# Patient Record
Sex: Female | Born: 1945 | Race: White | Hispanic: No | Marital: Married | Smoking: Never smoker
Health system: Southern US, Community
[De-identification: ages and names within clinical notes are randomized; demographics above are authoritative.]

## PROBLEM LIST (undated history)

## (undated) DIAGNOSIS — E663 Overweight: Secondary | ICD-10-CM

## (undated) DIAGNOSIS — K5900 Constipation, unspecified: Secondary | ICD-10-CM

## (undated) DIAGNOSIS — E039 Hypothyroidism, unspecified: Secondary | ICD-10-CM

## (undated) DIAGNOSIS — N6019 Diffuse cystic mastopathy of unspecified breast: Secondary | ICD-10-CM

## (undated) DIAGNOSIS — T7840XA Allergy, unspecified, initial encounter: Secondary | ICD-10-CM

## (undated) DIAGNOSIS — M810 Age-related osteoporosis without current pathological fracture: Secondary | ICD-10-CM

## (undated) DIAGNOSIS — K635 Polyp of colon: Secondary | ICD-10-CM

## (undated) HISTORY — DX: Hypothyroidism, unspecified: E03.9

## (undated) HISTORY — DX: Diffuse cystic mastopathy of unspecified breast: N60.19

## (undated) HISTORY — DX: Overweight: E66.3

## (undated) HISTORY — DX: Polyp of colon: K63.5

## (undated) HISTORY — PX: THYROIDECTOMY: SHX17

## (undated) HISTORY — DX: Age-related osteoporosis without current pathological fracture: M81.0

## (undated) HISTORY — DX: Constipation, unspecified: K59.00

## (undated) HISTORY — PX: BREAST BIOPSY: SHX20

## (undated) HISTORY — PX: BREAST EXCISIONAL BIOPSY: SUR124

## (undated) HISTORY — DX: Allergy, unspecified, initial encounter: T78.40XA

---

## 1999-07-12 ENCOUNTER — Encounter: Payer: Self-pay | Admitting: *Deleted

## 1999-07-12 ENCOUNTER — Encounter: Admission: RE | Admit: 1999-07-12 | Discharge: 1999-07-12 | Payer: Self-pay | Admitting: *Deleted

## 2000-03-19 ENCOUNTER — Encounter: Admission: RE | Admit: 2000-03-19 | Discharge: 2000-03-19 | Payer: Self-pay | Admitting: General Surgery

## 2000-03-19 ENCOUNTER — Encounter: Payer: Self-pay | Admitting: General Surgery

## 2000-08-26 ENCOUNTER — Other Ambulatory Visit: Admission: RE | Admit: 2000-08-26 | Discharge: 2000-08-26 | Payer: Self-pay | Admitting: *Deleted

## 2000-10-10 ENCOUNTER — Encounter (INDEPENDENT_AMBULATORY_CARE_PROVIDER_SITE_OTHER): Payer: Self-pay | Admitting: *Deleted

## 2000-10-10 ENCOUNTER — Ambulatory Visit (HOSPITAL_COMMUNITY): Admission: RE | Admit: 2000-10-10 | Discharge: 2000-10-10 | Payer: Self-pay | Admitting: Gastroenterology

## 2001-05-29 ENCOUNTER — Encounter: Admission: RE | Admit: 2001-05-29 | Discharge: 2001-05-29 | Payer: Self-pay | Admitting: *Deleted

## 2001-05-29 ENCOUNTER — Encounter: Payer: Self-pay | Admitting: *Deleted

## 2001-09-03 ENCOUNTER — Other Ambulatory Visit: Admission: RE | Admit: 2001-09-03 | Discharge: 2001-09-03 | Payer: Self-pay | Admitting: *Deleted

## 2002-06-19 ENCOUNTER — Encounter: Admission: RE | Admit: 2002-06-19 | Discharge: 2002-06-19 | Payer: Self-pay

## 2002-07-01 ENCOUNTER — Encounter: Payer: Self-pay | Admitting: Obstetrics and Gynecology

## 2002-07-01 ENCOUNTER — Encounter: Admission: RE | Admit: 2002-07-01 | Discharge: 2002-07-01 | Payer: Self-pay | Admitting: Obstetrics and Gynecology

## 2002-11-19 ENCOUNTER — Other Ambulatory Visit: Admission: RE | Admit: 2002-11-19 | Discharge: 2002-11-19 | Payer: Self-pay | Admitting: Obstetrics & Gynecology

## 2003-02-18 ENCOUNTER — Encounter: Admission: RE | Admit: 2003-02-18 | Discharge: 2003-02-18 | Payer: Self-pay

## 2003-08-11 ENCOUNTER — Encounter: Admission: RE | Admit: 2003-08-11 | Discharge: 2003-08-11 | Payer: Self-pay | Admitting: Obstetrics and Gynecology

## 2004-08-21 ENCOUNTER — Encounter: Admission: RE | Admit: 2004-08-21 | Discharge: 2004-08-21 | Payer: Self-pay | Admitting: Internal Medicine

## 2005-09-07 ENCOUNTER — Encounter: Admission: RE | Admit: 2005-09-07 | Discharge: 2005-09-07 | Payer: Self-pay | Admitting: Obstetrics and Gynecology

## 2006-09-09 ENCOUNTER — Encounter: Admission: RE | Admit: 2006-09-09 | Discharge: 2006-09-09 | Payer: Self-pay | Admitting: Family Medicine

## 2007-09-24 ENCOUNTER — Encounter: Admission: RE | Admit: 2007-09-24 | Discharge: 2007-09-24 | Payer: Self-pay | Admitting: Family Medicine

## 2007-09-30 ENCOUNTER — Encounter: Admission: RE | Admit: 2007-09-30 | Discharge: 2007-09-30 | Payer: Self-pay | Admitting: Family Medicine

## 2008-03-24 ENCOUNTER — Encounter: Admission: RE | Admit: 2008-03-24 | Discharge: 2008-03-24 | Payer: Self-pay | Admitting: Family Medicine

## 2008-09-27 ENCOUNTER — Encounter: Admission: RE | Admit: 2008-09-27 | Discharge: 2008-09-27 | Payer: Self-pay | Admitting: Obstetrics and Gynecology

## 2009-11-28 ENCOUNTER — Encounter: Admission: RE | Admit: 2009-11-28 | Discharge: 2009-11-28 | Payer: Self-pay | Admitting: Family Medicine

## 2010-06-04 ENCOUNTER — Encounter: Payer: Self-pay | Admitting: Family Medicine

## 2010-06-30 ENCOUNTER — Other Ambulatory Visit: Payer: Self-pay | Admitting: Obstetrics and Gynecology

## 2010-06-30 DIAGNOSIS — M8580 Other specified disorders of bone density and structure, unspecified site: Secondary | ICD-10-CM

## 2010-06-30 DIAGNOSIS — Z780 Asymptomatic menopausal state: Secondary | ICD-10-CM

## 2010-07-14 ENCOUNTER — Ambulatory Visit
Admission: RE | Admit: 2010-07-14 | Discharge: 2010-07-14 | Disposition: A | Discharge status: Home / Self Care | Payer: BC Managed Care – PPO | Source: Ambulatory Visit | Attending: Obstetrics and Gynecology | Admitting: Obstetrics and Gynecology

## 2010-07-14 DIAGNOSIS — M8580 Other specified disorders of bone density and structure, unspecified site: Secondary | ICD-10-CM

## 2010-09-29 NOTE — Procedures (Signed)
[place]. [facility]  Patient:    Ashlee Rollins, Ashlee Rollins                         MRN: [id number] Proc. Date: 10/10/00 Attending:  Anselmo Rod, M.D. CC:         Heather Roberts, M.D.   Procedure Report  DATE OF BIRTH:  March 21, 1946.  PROCEDURE:  Colonoscopy with snare polypectomy x 1.  ENDOSCOPIST:  Anselmo Rod, M.D.  INSTRUMENT USED:  Olympus video colonoscope.  INDICATION FOR PROCEDURE:  A 65 year old white female with a history of rectal bleeding and chronic constipation.  Rule out colonic polyps, masses, hemorrhoids, etc.  PREPROCEDURE PREPARATION:  Informed consent was procured from the patient. The patient was fasted for eight hours prior to the procedure and prepped with a bottle of magnesium citrate and a gallon of NuLytely the night prior to the procedure.  PREPROCEDURE PHYSICAL:  VITAL SIGNS:  The patient had stable vital signs.  NECK:  Supple.  CHEST:  Clear to auscultation.  S1, S2 regular.  ABDOMEN:  Soft with normal bowel sounds.  DESCRIPTION OF PROCEDURE:  The patient was placed in the left lateral decubitus position and sedated with 50 mg of Demerol and 7.5 mg of Versed intravenously.  Once the patient was adequately sedate and maintained on low-flow oxygen and continuous cardiac monitoring, the Olympus video colonoscope was advanced from the rectum to the cecum without difficulty.  The patient had an excellent prep.  The entire colonic mucosa appeared healthy except for a small, flat polyp snared at 50 cm.  There were small internal hemorrhoids appreciated on retroflexion.  No other abnormalities were noted. The procedure was complete up to the cecum.  The ileocecal valve and the appendiceal orifice were clearly visualized and photographed.  IMPRESSION: 1. Healthy-appearing colon except for a small flat polyp snared at 50 cm. 2. Small, nonbleeding internal hemorrhoids.  RECOMMENDATIONS:  1. Await pathology results. 2.  High-fiber diet. 3. Avoid nonsteroidals for now. 3. Outpatient follow-up in the next four weeks. DD:  10/10/00 TD:  10/10/00 Job: [id number]

## 2010-12-19 ENCOUNTER — Other Ambulatory Visit: Payer: Self-pay | Admitting: Family Medicine

## 2010-12-19 DIAGNOSIS — Z1231 Encounter for screening mammogram for malignant neoplasm of breast: Secondary | ICD-10-CM

## 2011-01-02 ENCOUNTER — Ambulatory Visit
Admission: RE | Admit: 2011-01-02 | Discharge: 2011-01-02 | Disposition: A | Discharge status: Home / Self Care | Payer: BC Managed Care – PPO | Source: Ambulatory Visit | Attending: Family Medicine | Admitting: Family Medicine

## 2011-01-02 DIAGNOSIS — Z1231 Encounter for screening mammogram for malignant neoplasm of breast: Secondary | ICD-10-CM

## 2012-02-12 ENCOUNTER — Other Ambulatory Visit: Payer: Self-pay | Admitting: Family Medicine

## 2012-02-12 DIAGNOSIS — Z1231 Encounter for screening mammogram for malignant neoplasm of breast: Secondary | ICD-10-CM

## 2012-03-04 ENCOUNTER — Ambulatory Visit: Payer: BC Managed Care – PPO

## 2012-03-07 ENCOUNTER — Ambulatory Visit
Admission: RE | Admit: 2012-03-07 | Discharge: 2012-03-07 | Disposition: A | Discharge status: Home / Self Care | Payer: BC Managed Care – PPO | Source: Ambulatory Visit | Attending: Family Medicine | Admitting: Family Medicine

## 2012-03-07 DIAGNOSIS — Z1231 Encounter for screening mammogram for malignant neoplasm of breast: Secondary | ICD-10-CM

## 2012-04-23 ENCOUNTER — Other Ambulatory Visit: Payer: Self-pay | Admitting: Dermatology

## 2012-09-23 ENCOUNTER — Ambulatory Visit (INDEPENDENT_AMBULATORY_CARE_PROVIDER_SITE_OTHER): Payer: BC Managed Care – PPO | Admitting: Family Medicine

## 2012-09-23 VITALS — BP 118/82 | HR 105 | Ht 67.0 in | Wt 166.0 lb

## 2012-09-23 DIAGNOSIS — E663 Overweight: Secondary | ICD-10-CM

## 2012-09-23 DIAGNOSIS — Z6825 Body mass index (BMI) 25.0-25.9, adult: Secondary | ICD-10-CM

## 2012-09-23 NOTE — Patient Instructions (Addendum)
1)  Weight Loss - Bring back the HCG supplies and Ashlee Rollins will mix it up for you.    Diet Following Bariatric Surgery The bariatric diet is designed to provide fluids and nourishment while promoting weight loss after bariatric surgery. The diet is divided into 3 stages. The rate of progression varies based on individual food tolerance. DIET FOLLOWING BARIATRIC SURGERY The diet following surgery is divided into 3 stages to allow a gradual adjustment. It is very important to the success of your surgery to:  Progress to each stage slowly.  Eat at set times.  Chew food well and stop eating when you are full.  Not drink liquids 30 minutes before and after meals. If you feel tightness or pressure in your chest, that means you are full. Wait 30 minutes before you try to eat again. STAGE 1 BARIATRIC DIET - ABOUT 2 WEEKS IN DURATION   The diet begins the day of surgery. It will last about 1 to 2 weeks after surgery. Your surgeon may have individual guidelines for you about specific foods or the progression of your diet. Follow your surgeon's guidelines.  If clear liquids are well-tolerated without vomiting, your caregiver will add a 4 oz to 6 oz high protein, low-calorie liquid supplement. You could add this to your meal plan 3 times daily. You will need at least 60 g to 80 g of protein daily or as determined by your Registered Dietitian.  Guidelines for choosing a protein supplement include:  At least 15 g of protein per 8 oz serving.  Less than 20 g total carbohydrate per 8 oz serving.  Less than 5 g fat per 8 oz serving.  Avoid carbonated beverages, caffeine, alcohol, and concentrated sweets such as sugar, cakes, and cookies.  Right after surgery, you may only be able to eat 3 to 4 tsp per meal. Your maximum volume should not exceed  to  cup total. Do not eat or drink more than 1 oz or 2 tbs every 15 minutes.  Take a chewable multivitamin and mineral supplement.  Drink at least 48  oz of fluid daily, which includes your protein supplement. Food and beverages from the list below are allowed at set times (for example at 8 AM, 12 noon, or 5 PM):  Decaffeinated coffee or tea.  100% fruit juice.  Diet or sugar-free drinks.  Broth.  Blenderized soup.  Skim milk or lactose-free milk.  Sugar-free gelatin dessert or frozen ice pops.  Mashed potatoes.  Yogurt (artificially sweetened).  Sugar-free pudding.  Blended low-fat cottage cheese.  Unsweetened applesauce, grits, or hot wheat cereal. Four to six ounces of a liquid protein supplement from the list below is recommended for snacks at 10 AM, 2 PM, and 8 PM.  STAGE 2 BARIATRIC DIET (SOFT DIET) - ABOUT 4 WEEKS IN DURATION  About 2 weeks after surgery, your caregiver will progress your diet to this stage. Foods may need to be blended to the consistency of applesauce. Choose low-fat foods (less than 5 g of fat per serving) and avoid concentrated sweets and sugar (less than 10 g of sugar per serving). Meals should not exceed  to  cup total. This stage will last about 4 weeks. It is recommended that you meet with your dietitian at this stage to begin preparation for the last stage. This stage consists of 3 meals a day with a liquid protein supplement between meals twice daily. Do not drink liquids with foods. You must wait 30 minutes for the  stomach pouch to empty before drinking. Chew food well. The food must be almost liquified before swallowing. Soft foods from the list below can now be slowly added to your diet:  Soft fruit (soft canned fruit in light syrup or natural juice, banana, melon, peaches, pears, or strawberries).  Cooked vegetables.  Toast or crackers (becomes soft after chewing 20 times).  Hot wheat cereal.  Fish.  Eggs (scrambled, soft-boiled). STAGE 3 BARIATRIC DIET (REGULAR DIET) - ABOUT 6 to 8 WEEKS AFTER SURGERY About 6 to 8 weeks after surgery, you will be advanced to food that is regular in  texture. This diet should include all food groups. The diet will continue to promote weight loss. Meals should not exceed  to 1 cup total. Your dietitian will be available to assist you in meal planning and additional behavioral strategies to make this final stage a long-term success. Slowly add foods of regular consistency and remember:  Eat only at your chosen meal times.  Minimize drinking with meals. You should drink 30 minutes before eating. Do not start drinking again for about 2 hours after eating.  Chew food well. Take small bites.  Think about the portion size of a healthy frozen meal. You will be able to eat most of this.  Make sure your meal is balanced with starch, protein, fruits, and vegetables.  When you feel full, stop eating. Document Released: 11/04/2002 Document Revised: 07/23/2011 Document Reviewed: 07/28/2010 [facility] Patient Information 2013 [place], [place].

## 2012-09-23 NOTE — Progress Notes (Signed)
  Subjective:    Patient ID: Ashlee Rollins, female    DOB: 1945/08/15, 67 y.o.   MRN: [id number]  HPI Ashlee Rollins is here today to discuss her weight.  She struggles with her weight loss.  She first did the "Step By Step" program back in 2010 when she weighed 179 lbs.  She got down to 158 in 2012 doing the HCG diet. She is trying to decide if she wants to do it again.  She has tried to lose weight on her own and has been unsuccessful.  She has been thinking about starting Weight Watchers again but she is just not sure what she wants to do.     Review of Systems  Constitutional: Positive for unexpected weight change (She continues to gain weight.  ). Negative for activity change and appetite change.  Respiratory: Negative for shortness of breath.   Cardiovascular: Negative for chest pain, palpitations and leg swelling.  Musculoskeletal:       She takes Mobic for joint pain.    Psychiatric/Behavioral: Negative.        Objective:   Physical Exam  Constitutional: She appears well-nourished. No distress.  HENT:  Head: Normocephalic.  Eyes: No scleral icterus.  Neck: No thyromegaly present.  Cardiovascular: Normal rate, regular rhythm and normal heart sounds.   Pulmonary/Chest: Effort normal and breath sounds normal.  Abdominal: There is no tenderness.  Musculoskeletal: She exhibits no edema and no tenderness.  Neurological: She is alert.  Skin: Skin is warm and dry.  Psychiatric: She has a normal mood and affect. Her behavior is normal. Judgment and thought content normal.          Assessment & Plan:

## 2012-10-06 ENCOUNTER — Encounter: Payer: Self-pay | Admitting: Family Medicine

## 2012-10-06 NOTE — Assessment & Plan Note (Signed)
Ellora would like to do the HCG diet but her husband is not very supportive of her doing this.  She thinks that she might be able to get her husband to do Weight Watchers with her.  She has decided that she will try to do this if he agrees to do Weight Watchers with her. If not then she will do HCG.

## 2012-11-07 ENCOUNTER — Ambulatory Visit (INDEPENDENT_AMBULATORY_CARE_PROVIDER_SITE_OTHER): Payer: Medicare Other | Admitting: *Deleted

## 2012-11-07 VITALS — Wt 170.1 lb

## 2012-11-07 DIAGNOSIS — E663 Overweight: Secondary | ICD-10-CM

## 2012-11-13 ENCOUNTER — Ambulatory Visit: Payer: Medicare Other | Admitting: *Deleted

## 2012-11-13 VITALS — Wt 165.1 lb

## 2012-11-13 DIAGNOSIS — E663 Overweight: Secondary | ICD-10-CM

## 2012-11-27 ENCOUNTER — Ambulatory Visit: Payer: Medicare Other | Admitting: *Deleted

## 2012-12-02 ENCOUNTER — Encounter: Payer: Self-pay | Admitting: Family Medicine

## 2012-12-02 ENCOUNTER — Ambulatory Visit (INDEPENDENT_AMBULATORY_CARE_PROVIDER_SITE_OTHER): Payer: Medicare Other | Admitting: Family Medicine

## 2012-12-02 VITALS — BP 119/78 | HR 75 | Resp 16 | Ht 67.0 in | Wt 163.0 lb

## 2012-12-02 DIAGNOSIS — E663 Overweight: Secondary | ICD-10-CM

## 2012-12-02 DIAGNOSIS — J309 Allergic rhinitis, unspecified: Secondary | ICD-10-CM

## 2012-12-02 MED ORDER — PHENTERMINE HCL 37.5 MG PO TABS: 37.5000 mg | ORAL_TABLET | Freq: Every day | ORAL | Status: DC

## 2012-12-02 MED ORDER — MOMETASONE FUROATE 50 MCG/ACT NA SUSP: 2.0000 | Freq: Every day | NASAL | Status: DC

## 2012-12-02 NOTE — Progress Notes (Signed)
  Subjective:    Patient ID: Ashlee Rollins, female    DOB: 06-12-1945, 67 y.o.   MRN: [id number]  HPI  [name] is here today to go discuss a few issues:    1)  Allergic Rhinitis:  She needs a refill on her Nasonex.   2)  Weight:  She has just completed her 4th week of the "Step By Step"  Program.  She is taking Phendimetrazine without any problem. She has also been receiving HCG injections without difficulty.  She has been following the 500 calorie diet and has lost 7 lbs since she started her plan.    Review of Systems  Constitutional: Positive for appetite change. Negative for activity change and fatigue.  HENT: Positive for congestion, rhinorrhea, sneezing and postnasal drip.   Eyes: Negative.   Respiratory: Negative for shortness of breath.   Cardiovascular: Negative for chest pain and leg swelling.  Gastrointestinal: Negative for abdominal pain, diarrhea and constipation.  Genitourinary: Negative.   Musculoskeletal: Negative.   Skin: Negative.   Psychiatric/Behavioral: Negative.   All other systems reviewed and are negative.    Past Medical History  Diagnosis Date  . Allergy   . Overweight (BMI 25.0-29.9)   . Hypothyroidism   . Osteoporosis   . Fibrocystic breast disease   . Colon polyps   . Constipation     Family History  Problem Relation Age of Onset  . Diabetes Brother     History   Social History Narrative   Marital Status: Married Jillyn Hidden)    Children: G2 P0020    Pets: Dogs (2)    Living Situation: Lives with husband.   Occupation: Retired Chief Strategy Officer) [employer]     Education:  Engineer, maintenance (IT)    Tobacco Use/Exposure:  None    Alcohol Use:  Occasional   Drug Use:  None   Diet:  Regular   Exercise:  [employer]    Hobbies: Reading, Gardening                Objective:   Physical Exam  Vitals reviewed. Constitutional: She is oriented to person, place, and time.  Eyes: Conjunctivae are normal. No scleral icterus.  Neck: Neck  supple. No thyromegaly present.  Cardiovascular: Normal rate, regular rhythm and normal heart sounds.   Pulmonary/Chest: Effort normal and breath sounds normal.  Musculoskeletal: She exhibits no edema and no tenderness.  Lymphadenopathy:    She has no cervical adenopathy.  Neurological: She is alert and oriented to person, place, and time.  Skin: Skin is warm and dry.  Psychiatric: She has a normal mood and affect. Her behavior is normal. Judgment and thought content normal.      Assessment & Plan:

## 2012-12-02 NOTE — Patient Instructions (Addendum)

## 2013-01-17 ENCOUNTER — Encounter: Payer: Self-pay | Admitting: Family Medicine

## 2013-01-17 DIAGNOSIS — J309 Allergic rhinitis, unspecified: Secondary | ICD-10-CM | POA: Insufficient documentation

## 2013-01-17 DIAGNOSIS — E663 Overweight: Secondary | ICD-10-CM | POA: Insufficient documentation

## 2013-01-17 NOTE — Assessment & Plan Note (Signed)
Refilled her Nasonex

## 2013-01-17 NOTE — Assessment & Plan Note (Signed)
She has done well on Phase II of the HCG diet. She is ready to move on to Phase III where she will limit her intake of starch and sugar and calories to 1000.    

## 2013-02-09 ENCOUNTER — Other Ambulatory Visit: Payer: Self-pay

## 2013-02-09 DIAGNOSIS — Z1231 Encounter for screening mammogram for malignant neoplasm of breast: Secondary | ICD-10-CM

## 2013-03-11 ENCOUNTER — Ambulatory Visit
Admission: RE | Admit: 2013-03-11 | Discharge: 2013-03-11 | Disposition: A | Discharge status: Home / Self Care | Payer: Medicare Other | Source: Ambulatory Visit

## 2013-03-11 DIAGNOSIS — Z1231 Encounter for screening mammogram for malignant neoplasm of breast: Secondary | ICD-10-CM

## 2013-07-31 ENCOUNTER — Encounter: Payer: Self-pay | Admitting: Family Medicine

## 2013-07-31 ENCOUNTER — Encounter (INDEPENDENT_AMBULATORY_CARE_PROVIDER_SITE_OTHER): Payer: Self-pay

## 2013-07-31 ENCOUNTER — Ambulatory Visit (INDEPENDENT_AMBULATORY_CARE_PROVIDER_SITE_OTHER): Payer: Medicare Other | Admitting: Family Medicine

## 2013-07-31 VITALS — BP 137/91 | HR 100 | Resp 16 | Wt 160.0 lb

## 2013-07-31 DIAGNOSIS — E039 Hypothyroidism, unspecified: Secondary | ICD-10-CM

## 2013-07-31 DIAGNOSIS — Z5181 Encounter for therapeutic drug level monitoring: Secondary | ICD-10-CM

## 2013-07-31 DIAGNOSIS — E559 Vitamin D deficiency, unspecified: Secondary | ICD-10-CM

## 2013-07-31 DIAGNOSIS — E785 Hyperlipidemia, unspecified: Secondary | ICD-10-CM

## 2013-07-31 DIAGNOSIS — E663 Overweight: Secondary | ICD-10-CM

## 2013-07-31 MED ORDER — PHENTERMINE HCL 37.5 MG PO TABS: 37.5000 mg | ORAL_TABLET | Freq: Every day | ORAL | Status: AC

## 2013-07-31 NOTE — Progress Notes (Signed)
Subjective:    Patient ID: Ashlee Rollins, female    DOB: 06-16-1945, 68 y.o.   MRN: [id number]  HPI  Ashlee Rollins is here today to discuss a couple of issues.  She has listed Dr. Alberteen SamZanard as her PCP on her insurance and needs to get established with our office as her PCP.  She continues to struggle with her weight and would like to lose the final 10 lbs that she has been trying to lose.  She has been trying to watch her diet and has been paying closer attention to her sugar intake. She has also been walking more but still can seem to get the weight off.  She would like to have a refill for Phentermine which seems to help her lose additional weight.    Review of Systems  Constitutional: Negative for activity change, fatigue and unexpected weight change.  HENT: Negative.   Eyes: Negative.   Respiratory: Negative for shortness of breath.   Cardiovascular: Negative for chest pain, palpitations and leg swelling.  Gastrointestinal: Negative for diarrhea and constipation.  Endocrine: Negative.   Genitourinary: Negative for difficulty urinating.  Musculoskeletal: Negative.   Skin: Negative.   Neurological: Negative.   Hematological: Negative for adenopathy. Does not bruise/bleed easily.  Psychiatric/Behavioral: Negative for sleep disturbance and dysphoric mood. The patient is not nervous/anxious.      Past Medical History  Diagnosis Date  . Allergy   . Overweight (BMI 25.0-29.9)   . Hypothyroidism   . Osteoporosis   . Fibrocystic breast disease   . Colon polyps   . Constipation      Past Surgical History  Procedure Laterality Date  . Thyroidectomy    . Breast biopsy       History   Social History Narrative   Marital Status: Married Jillyn Hidden([place])    Children: G2 P0020    Pets: Dogs (2)    Living Situation: Lives with husband.   Occupation: Retired Chief Strategy Officer(Special Education Teacher) [employer]     Education:  Engineer, maintenance (IT)College Graduate    Tobacco Use/Exposure:  None    Alcohol Use:  Occasional   Drug Use:  None   Diet:  Regular   Exercise:  [employer]    Hobbies: Reading, Gardening               Family History  Problem Relation Age of Onset  . Diabetes Brother   . COPD Mother     Smoker  . Cancer Paternal Aunt     Breast Cancer  . Cancer Paternal Grandmother     Breast Cancer     Current Outpatient Prescriptions on File Prior to Visit  Medication Sig Dispense Refill  . mometasone (NASONEX) 50 MCG/ACT nasal spray Place 2 sprays into the nose daily.  17 g  11  . REFISSA 0.05 % CREA        No current facility-administered medications on file prior to visit.     No Known Allergies      Objective:   Physical Exam  Vitals reviewed. Constitutional: She is oriented to person, place, and time.  Eyes: Conjunctivae are normal. No scleral icterus.  Neck: Neck supple. No thyromegaly present.  Cardiovascular: Normal rate, regular rhythm and normal heart sounds.   Pulmonary/Chest: Effort normal and breath sounds normal.  Musculoskeletal: She exhibits no edema and no tenderness.  Lymphadenopathy:    She has no cervical adenopathy.  Neurological: She is alert and oriented to person, place, and time.  Skin: Skin is  warm and dry.  Psychiatric: She has a normal mood and affect. Her behavior is normal. Judgment and thought content normal.      Assessment & Plan:    Ashlee Rollins was seen today for medication management.  Diagnoses and associated orders for this visit:  Other and unspecified hyperlipidemia - COMPLETE METABOLIC PANEL WITH GFR; Future - Lipid panel; Future  Unspecified hypothyroidism - TSH; Future  Unspecified vitamin D deficiency - Vit D  25 hydroxy (rtn osteoporosis monitoring); Future  Overweight - phentermine (ADIPEX-P) 37.5 MG tablet; Take 1 tablet (37.5 mg total) by mouth daily before breakfast.  Encounter for therapeutic drug monitoring - CBC w/Diff; Future   TIME SPENT "FACE TO FACE" WITH PATIENT -  30 MINS

## 2013-08-12 ENCOUNTER — Other Ambulatory Visit: Payer: Self-pay | Admitting: Family Medicine

## 2013-08-12 ENCOUNTER — Other Ambulatory Visit: Payer: Self-pay | Admitting: *Deleted

## 2013-08-12 DIAGNOSIS — E559 Vitamin D deficiency, unspecified: Secondary | ICD-10-CM

## 2013-08-12 DIAGNOSIS — E785 Hyperlipidemia, unspecified: Secondary | ICD-10-CM

## 2013-08-12 DIAGNOSIS — R5383 Other fatigue: Secondary | ICD-10-CM

## 2013-08-13 ENCOUNTER — Other Ambulatory Visit: Payer: Medicare Other

## 2013-08-13 LAB — COMPLETE METABOLIC PANEL WITH GFR
ALT: 22 U/L (ref 0–35)
AST: 22 U/L (ref 0–37)
Albumin: 4.2 g/dL (ref 3.5–5.2)
Alkaline Phosphatase: 70 U/L (ref 39–117)
BUN: 13 mg/dL (ref 6–23)
CO2: 26 mEq/L (ref 19–32)
Calcium: 9.1 mg/dL (ref 8.4–10.5)
Chloride: 103 mEq/L (ref 96–112)
Creat: 0.67 mg/dL (ref 0.50–1.10)
GFR, Est African American: >89 mL/min
GFR, Est Non African American: >89 mL/min
Glucose, Bld: 88 mg/dL (ref 70–99)
Potassium: 4.9 mEq/L (ref 3.5–5.3)
Sodium: 138 mEq/L (ref 135–145)
Total Bilirubin: 0.4 mg/dL (ref 0.2–1.2)
Total Protein: 6.8 g/dL (ref 6.0–8.3)

## 2013-08-13 LAB — LIPID PANEL
Cholesterol: 162 mg/dL (ref 0–200)
HDL: 56 mg/dL (ref >39)
LDL Cholesterol: 89 mg/dL (ref 0–99)
Total CHOL/HDL Ratio: 2.9 Ratio
Triglycerides: 83 mg/dL (ref <150)
VLDL: 17 mg/dL (ref 0–40)

## 2013-08-13 LAB — CBC WITH DIFFERENTIAL/PLATELET
Basophils Absolute: 0.1 10*3/uL (ref 0.0–0.1)
Basophils Relative: 1 % (ref 0–1)
Eosinophils Absolute: 0.3 10*3/uL (ref 0.0–0.7)
Eosinophils Relative: 5 % (ref 0–5)
HCT: 36.2 % (ref 36.0–46.0)
Hemoglobin: 12.2 g/dL (ref 12.0–15.0)
Lymphocytes Relative: 41 % (ref 12–46)
Lymphs Abs: 2.1 10*3/uL (ref 0.7–4.0)
MCH: 28.6 pg (ref 26.0–34.0)
MCHC: 33.7 g/dL (ref 30.0–36.0)
MCV: 84.8 fL (ref 78.0–100.0)
Monocytes Absolute: 0.5 10*3/uL (ref 0.1–1.0)
Monocytes Relative: 10 % (ref 3–12)
Neutro Abs: 2.2 10*3/uL (ref 1.7–7.7)
Neutrophils Relative %: 43 % (ref 43–77)
Platelets: 304 10*3/uL (ref 150–400)
RBC: 4.27 MIL/uL (ref 3.87–5.11)
RDW: 13.8 % (ref 11.5–15.5)
WBC: 5.1 10*3/uL (ref 4.0–10.5)

## 2013-08-13 LAB — TSH: TSH: 0.092 u[IU]/mL — ABNORMAL LOW (ref 0.350–4.500)

## 2013-08-14 DIAGNOSIS — E039 Hypothyroidism, unspecified: Secondary | ICD-10-CM | POA: Insufficient documentation

## 2013-08-14 DIAGNOSIS — Z5181 Encounter for therapeutic drug level monitoring: Secondary | ICD-10-CM | POA: Insufficient documentation

## 2013-08-14 DIAGNOSIS — E559 Vitamin D deficiency, unspecified: Secondary | ICD-10-CM | POA: Insufficient documentation

## 2013-08-14 DIAGNOSIS — E785 Hyperlipidemia, unspecified: Secondary | ICD-10-CM | POA: Insufficient documentation

## 2013-08-14 LAB — T4, FREE: Free T4: 1.03 ng/dL (ref 0.80–1.80)

## 2013-08-14 LAB — VITAMIN D 25 HYDROXY (VIT D DEFICIENCY, FRACTURES): Vit D, 25-Hydroxy: 65 ng/mL (ref 30–89)

## 2013-08-14 LAB — T3, FREE: T3, Free: 4.8 pg/mL — ABNORMAL HIGH (ref 2.3–4.2)

## 2013-08-25 ENCOUNTER — Ambulatory Visit (INDEPENDENT_AMBULATORY_CARE_PROVIDER_SITE_OTHER): Payer: Medicare Other | Admitting: Family Medicine

## 2013-08-25 ENCOUNTER — Encounter: Payer: Self-pay | Admitting: Family Medicine

## 2013-08-25 VITALS — Wt 160.0 lb

## 2013-08-25 DIAGNOSIS — E039 Hypothyroidism, unspecified: Secondary | ICD-10-CM

## 2013-08-25 DIAGNOSIS — F3289 Other specified depressive episodes: Secondary | ICD-10-CM

## 2013-08-25 DIAGNOSIS — R5383 Other fatigue: Secondary | ICD-10-CM

## 2013-08-25 DIAGNOSIS — F329 Major depressive disorder, single episode, unspecified: Secondary | ICD-10-CM

## 2013-08-25 DIAGNOSIS — R5381 Other malaise: Secondary | ICD-10-CM

## 2013-08-25 DIAGNOSIS — E663 Overweight: Secondary | ICD-10-CM

## 2013-08-25 MED ORDER — BUPROPION HCL ER (SR) 150 MG PO TB12
ORAL_TABLET | ORAL | Status: DC
Start: 2013-08-25 — End: 2013-12-17

## 2013-08-25 MED ORDER — LIOTHYRONINE SODIUM 5 MCG PO TABS: 5.0000 ug | ORAL_TABLET | Freq: Every day | ORAL | Status: DC

## 2013-08-25 MED ORDER — LEVOTHYROXINE SODIUM 100 MCG PO TABS: 100.0000 ug | ORAL_TABLET | Freq: Every day | ORAL | Status: DC

## 2013-08-25 NOTE — Patient Instructions (Signed)
1)  Hypothyroidism - Finish your Nature-Throid with lowering the dosage a bit the way we discussed.  After you run out of this then start on the levothyroxine 100 mcg in the am as directed and see how you feel.  If you feel the same then stay on this and we'll recheck your level in 8 weeks on the levothyroxine.  If you decide to take the Cytomel 5 mcg then let's recheck that level after you have been on that combination at least 6 weeks.    2)  Mood - You might consider adding some Prozac 10 - 20 mg per day.    3)  Energy - You might also consider adding some Wellbutrin SR.  You would start with 150 mg in the am for 6 days then you could increase to 2 pills per day.    4)  Weight - Do not take the Wellbutrin along with the phentermine.     Hypothyroidism The thyroid is a large gland located in the lower front of your neck. The thyroid gland helps control metabolism. Metabolism is how your body handles food. It controls metabolism with the hormone thyroxine. When this gland is underactive (hypothyroid), it produces too little hormone.  CAUSES These include:   Absence or destruction of thyroid tissue.  Goiter due to iodine deficiency.  Goiter due to medications.  Congenital defects (since birth).  Problems with the pituitary. This causes a lack of TSH (thyroid stimulating hormone). This hormone tells the thyroid to turn out more hormone. SYMPTOMS  Lethargy (feeling as though you have no energy)  Cold intolerance  Weight gain (in spite of normal food intake)  Dry skin  Coarse hair  Menstrual irregularity (if severe, may lead to infertility)  Slowing of thought processes Cardiac problems are also caused by insufficient amounts of thyroid hormone. Hypothyroidism in the newborn is cretinism, and is an extreme form. It is important that this form be treated adequately and immediately or it will lead rapidly to retarded physical and mental development. DIAGNOSIS  To prove  hypothyroidism, your caregiver may do blood tests and ultrasound tests. Sometimes the signs are hidden. It may be necessary for your caregiver to watch this illness with blood tests either before or after diagnosis and treatment. TREATMENT  Low levels of thyroid hormone are increased by using synthetic thyroid hormone. This is a safe, effective treatment. It usually takes about four weeks to gain the full effects of the medication. After you have the full effect of the medication, it will generally take another four weeks for problems to leave. Your caregiver may start you on low doses. If you have had heart problems the dose may be gradually increased. It is generally not an emergency to get rapidly to normal. HOME CARE INSTRUCTIONS   Take your medications as your caregiver suggests. Let your caregiver know of any medications you are taking or start taking. Your caregiver will help you with dosage schedules.  As your condition improves, your dosage needs may increase. It will be necessary to have continuing blood tests as suggested by your caregiver.  Report all suspected medication side effects to your caregiver. SEEK MEDICAL CARE IF: Seek medical care if you develop:  Sweating.  Tremulousness (tremors).  Anxiety.  Rapid weight loss.  Heat intolerance.  Emotional swings.  Diarrhea.  Weakness. SEEK IMMEDIATE MEDICAL CARE IF:  You develop chest pain, an irregular heart beat (palpitations), or a rapid heart beat. MAKE SURE YOU:   Understand these instructions.  Will watch your condition.  Will get help right away if you are not doing well or get worse. Document Released: 04/30/2005 Document Revised: 07/23/2011 Document Reviewed: 12/19/2007 [facility] Patient Information 2014 [place].

## 2013-08-25 NOTE — Progress Notes (Signed)
Subjective:    Patient ID: Ashlee Rollins, female    DOB: July 09, 1945, 68 y.o.   MRN: [id number]  HPI  Ashlee Rollins is here today to go over her most recent lab results.  She has done well since her last office visit.  She wants to discuss a couple of issues:   1)  Hypothyroidsm - She currently takes a natural supplement for her hypothyroidism.  She has not noted any improvement on her energy level since she started taking this supplement.    2)  Immunizaitons - She wants to get her TDaP and pneumonia vaccine.  Her insurance does not cover her shingles vaccine during the office visit.  She needs a prescription for it.    3)  Fatigue/Weight - She feels fatigued.     Review of Systems  Constitutional: Positive for fatigue and unexpected weight change. Negative for activity change and appetite change.  Respiratory: Negative for shortness of breath.   Cardiovascular: Negative for chest pain and palpitations.  Gastrointestinal: Negative.   Genitourinary: Negative.   Neurological: Negative.   Psychiatric/Behavioral: Negative.      Past Medical History  Diagnosis Date  . Allergy   . Overweight (BMI 25.0-29.9)   . Hypothyroidism   . Osteoporosis   . Fibrocystic breast disease   . Colon polyps   . Constipation      Past Surgical History  Procedure Laterality Date  . Thyroidectomy    . Breast biopsy       History   Social History Narrative   Marital Status: Married Jillyn Hidden([place])    Children: G2 P0020    Pets: Dogs (2)    Living Situation: Lives with husband.   Occupation: Retired Chief Strategy Officer(Special Education Teacher) [employer]     Education:  Engineer, maintenance (IT)College Graduate    Tobacco Use/Exposure:  None    Alcohol Use:  Occasional   Drug Use:  None   Diet:  Regular   Exercise:  [employer]    Hobbies: Reading, Gardening               Family History  Problem Relation Age of Onset  . Diabetes Brother   . COPD Mother     Smoker  . Cancer Paternal Aunt     Breast Cancer  . Cancer Paternal  Grandmother     Breast Cancer     Current Outpatient Prescriptions on File Prior to Visit  Medication Sig Dispense Refill  . mometasone (NASONEX) 50 MCG/ACT nasal spray Place 2 sprays into the nose daily.  17 g  11  . REFISSA 0.05 % CREA       . phentermine (ADIPEX-P) 37.5 MG tablet Take 1 tablet (37.5 mg total) by mouth daily before breakfast.  30 tablet  0   No current facility-administered medications on file prior to visit.     No Known Allergies     Objective:   Physical Exam  Vitals reviewed. Constitutional: She is oriented to person, place, and time.  Eyes: Conjunctivae are normal. No scleral icterus.  Neck: Neck supple. No thyromegaly present.  Cardiovascular: Normal rate, regular rhythm and normal heart sounds.   Pulmonary/Chest: Effort normal and breath sounds normal.  Musculoskeletal: She exhibits no edema and no tenderness.  Lymphadenopathy:    She has no cervical adenopathy.  Neurological: She is alert and oriented to person, place, and time.  Skin: Skin is warm and dry.  Psychiatric: She has a normal mood and affect. Her behavior is normal. Judgment and  thought content normal.      Assessment & Plan:    Ashlee Rollins was seen today for lab results.  Diagnoses and associated orders for this visit:  Unspecified hypothyroidism Comments: She is going to switch from [employer] Throid to levothyroxine and may possibly add Cytomel.   - levothyroxine (SYNTHROID, LEVOTHROID) 100 MCG tablet; Take 1 tablet (100 mcg total) by mouth daily. - liothyronine (CYTOMEL) 5 MCG tablet; Take 1 tablet (5 mcg total) by mouth daily.  Other malaise and fatigue Comments: We'll see if she feels more energy after adding Wellbutrin.   - buPROPion (WELLBUTRIN SR) 150 MG 12 hr tablet; Take 1 tab po before breakfast and lunch  Depressive disorder, not elsewhere classified  Overweight   TIME SPENT "FACE TO FACE" WITH PATIENT -  30 MINS

## 2013-10-07 ENCOUNTER — Other Ambulatory Visit: Payer: Medicare Other

## 2013-11-13 DIAGNOSIS — F329 Major depressive disorder, single episode, unspecified: Secondary | ICD-10-CM | POA: Insufficient documentation

## 2013-11-13 DIAGNOSIS — R5381 Other malaise: Secondary | ICD-10-CM | POA: Insufficient documentation

## 2013-11-13 DIAGNOSIS — F3289 Other specified depressive episodes: Secondary | ICD-10-CM | POA: Insufficient documentation

## 2013-11-13 DIAGNOSIS — R5383 Other fatigue: Secondary | ICD-10-CM

## 2013-12-17 ENCOUNTER — Ambulatory Visit (INDEPENDENT_AMBULATORY_CARE_PROVIDER_SITE_OTHER): Payer: Medicare Other | Admitting: Family Medicine

## 2013-12-17 ENCOUNTER — Encounter: Payer: Self-pay | Admitting: Family Medicine

## 2013-12-17 VITALS — BP 143/86 | HR 70 | Resp 16 | Ht 67.0 in | Wt 161.0 lb

## 2013-12-17 DIAGNOSIS — Z23 Encounter for immunization: Secondary | ICD-10-CM | POA: Diagnosis not present

## 2013-12-17 DIAGNOSIS — J309 Allergic rhinitis, unspecified: Secondary | ICD-10-CM

## 2013-12-17 DIAGNOSIS — G4700 Insomnia, unspecified: Secondary | ICD-10-CM | POA: Diagnosis not present

## 2013-12-17 DIAGNOSIS — E2839 Other primary ovarian failure: Secondary | ICD-10-CM | POA: Diagnosis not present

## 2013-12-17 DIAGNOSIS — J302 Other seasonal allergic rhinitis: Secondary | ICD-10-CM

## 2013-12-17 DIAGNOSIS — M129 Arthropathy, unspecified: Secondary | ICD-10-CM | POA: Diagnosis not present

## 2013-12-17 DIAGNOSIS — M1990 Unspecified osteoarthritis, unspecified site: Secondary | ICD-10-CM

## 2013-12-17 MED ORDER — MOMETASONE FUROATE 50 MCG/ACT NA SUSP: 2.0000 | Freq: Every day | NASAL | Status: AC

## 2013-12-17 MED ORDER — PROGESTERONE MICRONIZED 100 MG PO CAPS: 100.0000 mg | ORAL_CAPSULE | Freq: Every day | ORAL | Status: AC

## 2013-12-17 MED ORDER — TRAMADOL HCL 50 MG PO TABS: 50.0000 mg | ORAL_TABLET | Freq: Two times a day (BID) | ORAL | Status: AC | PRN

## 2013-12-17 NOTE — Patient Instructions (Signed)
1)  Pain - Fish Oil (DHA/EPA) 2000 mg - 4000 mg (anti-inflammatory effect) [employer]; tramadol 50 mg up to 2 times per day plus 1/2 Mobic plus Tylenol 1000 mg up to 3 x per day.  Consider Cymbalta.  Glucosamine (1500 mg per day).  Low carb diet    Arthritis, Nonspecific Arthritis is inflammation of a joint. This usually means pain, redness, warmth or swelling are present. One or more joints may be involved. There are a number of types of arthritis. Your caregiver may not be able to tell what type of arthritis you have right away. CAUSES  The most common cause of arthritis is the wear and tear on the joint (osteoarthritis). This causes damage to the cartilage, which can break down over time. The knees, hips, back and neck are most often affected by this type of arthritis. Other types of arthritis and common causes of joint pain include:  Sprains and other injuries near the joint. Sometimes minor sprains and injuries cause pain and swelling that develop hours later.  Rheumatoid arthritis. This affects hands, feet and knees. It usually affects both sides of your body at the same time. It is often associated with chronic ailments, fever, weight loss and general weakness.  Crystal arthritis. Gout and pseudo gout can cause occasional acute severe pain, redness and swelling in the foot, ankle, or knee.  Infectious arthritis. Bacteria can get into a joint through a break in overlying skin. This can cause infection of the joint. Bacteria and viruses can also spread through the blood and affect your joints.  Drug, infectious and allergy reactions. Sometimes joints can become mildly painful and slightly swollen with these types of illnesses. SYMPTOMS   Pain is the main symptom.  Your joint or joints can also be red, swollen and warm or hot to the touch.  You may have a fever with certain types of arthritis, or even feel overall ill.  The joint with arthritis will hurt with movement.  Stiffness is present with some types of arthritis. DIAGNOSIS  Your caregiver will suspect arthritis based on your description of your symptoms and on your exam. Testing may be needed to find the type of arthritis:  Blood and sometimes urine tests.  X-ray tests and sometimes CT or MRI scans.  Removal of fluid from the joint (arthrocentesis) is done to check for bacteria, crystals or other causes. Your caregiver (or a specialist) will numb the area over the joint with a local anesthetic, and use a needle to remove joint fluid for examination. This procedure is only minimally uncomfortable.  Even with these tests, your caregiver may not be able to tell what kind of arthritis you have. Consultation with a specialist (rheumatologist) may be helpful. TREATMENT  Your caregiver will discuss with you treatment specific to your type of arthritis. If the specific type cannot be determined, then the following general recommendations may apply. Treatment of severe joint pain includes:  Rest.  Elevation.  Anti-inflammatory medication (for example, ibuprofen) may be prescribed. Avoiding activities that cause increased pain.  Only take over-the-counter or prescription medicines for pain and discomfort as recommended by your caregiver.  Cold packs over an inflamed joint may be used for 10 to 15 minutes every hour. Hot packs sometimes feel better, but do not use overnight. Do not use hot packs if you are diabetic without your caregiver's permission.  A cortisone shot into arthritic joints may help reduce pain and swelling.  Any acute arthritis that gets worse  over the next 1 to 2 days needs to be looked at to be sure there is no joint infection. Long-term arthritis treatment involves modifying activities and lifestyle to reduce joint stress jarring. This can include weight loss. Also, exercise is needed to nourish the joint cartilage and remove waste. This helps keep the muscles around the joint  strong. HOME CARE INSTRUCTIONS   Do not take aspirin to relieve pain if gout is suspected. This elevates uric acid levels.  Only take over-the-counter or prescription medicines for pain, discomfort or fever as directed by your caregiver.  Rest the joint as much as possible.  If your joint is swollen, keep it elevated.  Use crutches if the painful joint is in your leg.  Drinking plenty of fluids may help for certain types of arthritis.  Follow your caregiver's dietary instructions.  Try low-impact exercise such as:  Swimming.  Water aerobics.  Biking.  Walking.  Morning stiffness is often relieved by a warm shower.  Put your joints through regular range-of-motion. SEEK MEDICAL CARE IF:   You do not feel better in 24 hours or are getting worse.  You have side effects to medications, or are not getting better with treatment. SEEK IMMEDIATE MEDICAL CARE IF:   You have a fever.  You develop severe joint pain, swelling or redness.  Many joints are involved and become painful and swollen.  There is severe back pain and/or leg weakness.  You have loss of bowel or bladder control. Document Released: 06/07/2004 Document Revised: 07/23/2011 Document Reviewed: 06/23/2008 [facility] Patient Information 2015 [place], [place]. This information is not intended to replace advice given to you by your health care provider. Make sure you discuss any questions you have with your health care provider.

## 2013-12-17 NOTE — Progress Notes (Signed)
Subjective:    Patient ID: Ashlee Rollins, female    DOB: 1946-01-26, 68 y.o.   MRN: [id number]  HPI  Ashlee Rollins is here today to discuss a couple of issues and to get medication refills.     1)  Hypothyroidism - She decided to stop the Cytomel and levothyroxine and went back on the Nature-Throid because she did not like how she felt on the combination.   2)  Allergic Rhinitis - She needs to have her Nasonex refilled.    3)  Weight - She continues to not be able to lose the weight that she wants to lose.    4)  Generalized Pain - She is having a lot of generalized pain.    5)  Sleeping Problems - She has not been sleeping very well.     Review of Systems  Constitutional: Negative for activity change, appetite change and fatigue.  Cardiovascular: Negative for chest pain, palpitations and leg swelling.  Endocrine: Negative for cold intolerance and heat intolerance.  All other systems reviewed and are negative.    Past Medical History  Diagnosis Date  . Allergy   . Overweight (BMI 25.0-29.9)   . Hypothyroidism   . Osteoporosis   . Fibrocystic breast disease   . Colon polyps   . Constipation      Past Surgical History  Procedure Laterality Date  . Thyroidectomy    . Breast biopsy       History   Social History Narrative   Marital Status: Married Ashlee Rollins)    Children: G2 P0020    Pets: Dogs (2)    Living Situation: Lives with husband.   Occupation: Retired Chief Strategy Officer) [employer]     Education:  Engineer, maintenance (IT)    Tobacco Use/Exposure:  None    Alcohol Use:  Occasional   Drug Use:  None   Diet:  Regular   Exercise:  [employer]    Hobbies: Reading, Gardening               Family History  Problem Relation Age of Onset  . Diabetes Brother   . COPD Mother     Smoker  . Cancer Paternal Aunt     Breast Cancer  . Cancer Paternal Grandmother     Breast Cancer     Current Outpatient Prescriptions on File Prior to Visit  Medication Sig  Dispense Refill  . phentermine (ADIPEX-P) 37.5 MG tablet Take 1 tablet (37.5 mg total) by mouth daily before breakfast.  30 tablet  0   No current facility-administered medications on file prior to visit.     No Known Allergies   Immunization History  Administered Date(s) Administered  . Pneumococcal Polysaccharide-23 12/17/2013       Objective:   Physical Exam  Vitals reviewed. Constitutional: She is oriented to person, place, and time.  Eyes: Conjunctivae are normal. No scleral icterus.  Neck: Neck supple. No thyromegaly present.  Cardiovascular: Normal rate, regular rhythm and normal heart sounds.   Pulmonary/Chest: Effort normal and breath sounds normal.  Musculoskeletal: She exhibits no edema and no tenderness.  Lymphadenopathy:    She has no cervical adenopathy.  Neurological: She is alert and oriented to person, place, and time.  Skin: Skin is warm and dry.  Psychiatric: She has a normal mood and affect. Her behavior is normal. Judgment and thought content normal.      Assessment & Plan:    Ashlee Rollins was seen today for medication management.  Diagnoses and associated orders for this visit:  Seasonal allergies Comments: Refilled her Nasonex.   - mometasone (NASONEX) 50 MCG/ACT nasal spray; Place 2 sprays into the nose daily.  Arthritis Comments: We discussed several things she might try for her pain. She was given a prescription for tramadol.   - traMADol (ULTRAM) 50 MG tablet; Take 1 tablet (50 mg total) by mouth every 12 (twelve) hours as needed for moderate pain.  Insomnia Comments: She is to try some Prometrium for her sleep and mood.   - progesterone (PROMETRIUM) 100 MG capsule; Take 1 capsule (100 mg total) by mouth at bedtime.  Estrogen deficiency Comments: Sending for a bone density.   - DG Bone Density; Future  Need for prophylactic vaccination against Streptococcus pneumoniae (pneumococcus) - Pneumococcal polysaccharide vaccine 23-valent greater  than or equal to 2yo subcutaneous/IM   TIME SPENT "FACE TO FACE" WITH PATIENT -  30 MINS

## 2013-12-21 ENCOUNTER — Telehealth: Payer: Self-pay | Admitting: Family Medicine

## 2013-12-21 DIAGNOSIS — J302 Other seasonal allergic rhinitis: Secondary | ICD-10-CM | POA: Insufficient documentation

## 2013-12-21 DIAGNOSIS — M1990 Unspecified osteoarthritis, unspecified site: Secondary | ICD-10-CM | POA: Insufficient documentation

## 2013-12-21 DIAGNOSIS — E2839 Other primary ovarian failure: Secondary | ICD-10-CM | POA: Insufficient documentation

## 2013-12-21 DIAGNOSIS — G4700 Insomnia, unspecified: Secondary | ICD-10-CM | POA: Insufficient documentation

## 2013-12-21 NOTE — Telephone Encounter (Signed)
[facility] states that Medicare will not accept the dx: menopause for bone density. She would like to know if you could change the dx to estrogen deficiency? Thanks!

## 2013-12-22 NOTE — Telephone Encounter (Signed)
Dr. Abner GreenspanBlyth,  This is the other Dr. Alberteen SamZanard patient I was talking to you about. Regarding the dx code for the bone density. Thanks!

## 2013-12-22 NOTE — Telephone Encounter (Signed)
So Dr Alberteen SamZanard is supposed to be handling these for now.

## 2014-01-11 NOTE — Telephone Encounter (Signed)
Consuella Lose,   This is still in my patient calls section.  I am trying to get everything cleared out.  I put in an order and listed estrogen deficiency as the diagnosis back on the 11th.    I assume that this worked but I am not sure.  Please check with the [facility] and make sure that they have all that they need.  I have not received a copy of her result so I am not sure that this was taken care of.

## 2014-01-25 ENCOUNTER — Other Ambulatory Visit: Payer: Medicare Other

## 2014-01-27 ENCOUNTER — Ambulatory Visit
Admission: RE | Admit: 2014-01-27 | Discharge: 2014-01-27 | Disposition: A | Discharge status: Home / Self Care | Payer: Medicare Other | Source: Ambulatory Visit | Attending: Family Medicine | Admitting: Family Medicine

## 2014-01-27 DIAGNOSIS — E2839 Other primary ovarian failure: Secondary | ICD-10-CM

## 2014-02-22 ENCOUNTER — Other Ambulatory Visit: Payer: Self-pay

## 2014-02-22 DIAGNOSIS — Z1239 Encounter for other screening for malignant neoplasm of breast: Secondary | ICD-10-CM

## 2014-03-16 ENCOUNTER — Ambulatory Visit: Payer: Medicare Other

## 2014-04-01 ENCOUNTER — Other Ambulatory Visit: Payer: Self-pay

## 2014-04-01 DIAGNOSIS — Z1231 Encounter for screening mammogram for malignant neoplasm of breast: Secondary | ICD-10-CM

## 2014-04-02 ENCOUNTER — Ambulatory Visit: Payer: Medicare Other

## 2014-04-06 ENCOUNTER — Ambulatory Visit
Admission: RE | Admit: 2014-04-06 | Discharge: 2014-04-06 | Disposition: A | Discharge status: Home / Self Care | Payer: Medicare Other | Source: Ambulatory Visit

## 2014-04-06 DIAGNOSIS — Z1231 Encounter for screening mammogram for malignant neoplasm of breast: Secondary | ICD-10-CM

## 2015-02-16 ENCOUNTER — Other Ambulatory Visit: Payer: Self-pay | Admitting: Family Medicine

## 2015-02-16 DIAGNOSIS — E049 Nontoxic goiter, unspecified: Secondary | ICD-10-CM

## 2015-02-18 ENCOUNTER — Ambulatory Visit
Admission: RE | Admit: 2015-02-18 | Discharge: 2015-02-18 | Disposition: A | Discharge status: Home / Self Care | Payer: Medicare Other | Source: Ambulatory Visit | Attending: Family Medicine | Admitting: Family Medicine

## 2015-02-18 DIAGNOSIS — E049 Nontoxic goiter, unspecified: Secondary | ICD-10-CM

## 2015-04-11 ENCOUNTER — Other Ambulatory Visit: Payer: Self-pay

## 2015-04-11 DIAGNOSIS — Z1231 Encounter for screening mammogram for malignant neoplasm of breast: Secondary | ICD-10-CM

## 2015-05-11 ENCOUNTER — Ambulatory Visit: Payer: Medicare Other

## 2015-05-12 ENCOUNTER — Ambulatory Visit
Admission: RE | Admit: 2015-05-12 | Discharge: 2015-05-12 | Disposition: A | Discharge status: Home / Self Care | Payer: Medicare Other | Source: Ambulatory Visit

## 2015-05-12 DIAGNOSIS — Z1231 Encounter for screening mammogram for malignant neoplasm of breast: Secondary | ICD-10-CM

## 2016-05-15 ENCOUNTER — Other Ambulatory Visit: Payer: Self-pay | Admitting: Family Medicine

## 2016-05-15 DIAGNOSIS — Z1231 Encounter for screening mammogram for malignant neoplasm of breast: Secondary | ICD-10-CM

## 2016-06-07 ENCOUNTER — Ambulatory Visit
Admission: RE | Admit: 2016-06-07 | Discharge: 2016-06-07 | Disposition: A | Discharge status: Home / Self Care | Payer: Medicare Other | Source: Ambulatory Visit | Attending: Family Medicine | Admitting: Family Medicine

## 2016-06-07 DIAGNOSIS — Z1231 Encounter for screening mammogram for malignant neoplasm of breast: Secondary | ICD-10-CM

## 2017-05-13 ENCOUNTER — Other Ambulatory Visit: Payer: Self-pay | Admitting: Family Medicine

## 2017-05-13 DIAGNOSIS — Z139 Encounter for screening, unspecified: Secondary | ICD-10-CM

## 2017-06-10 ENCOUNTER — Ambulatory Visit: Payer: Medicare Other

## 2017-06-10 ENCOUNTER — Ambulatory Visit
Admission: RE | Admit: 2017-06-10 | Discharge: 2017-06-10 | Disposition: A | Discharge status: Home / Self Care | Payer: Medicare Other | Source: Ambulatory Visit | Attending: Family Medicine | Admitting: Family Medicine

## 2017-06-10 DIAGNOSIS — Z139 Encounter for screening, unspecified: Secondary | ICD-10-CM

## 2018-05-19 ENCOUNTER — Other Ambulatory Visit: Payer: Self-pay | Admitting: Family Medicine

## 2018-05-19 DIAGNOSIS — Z1231 Encounter for screening mammogram for malignant neoplasm of breast: Secondary | ICD-10-CM

## 2018-06-16 ENCOUNTER — Ambulatory Visit
Admission: RE | Admit: 2018-06-16 | Discharge: 2018-06-16 | Disposition: A | Discharge status: Home / Self Care | Payer: Medicare Other | Source: Ambulatory Visit | Attending: Family Medicine | Admitting: Family Medicine

## 2018-06-16 DIAGNOSIS — Z1231 Encounter for screening mammogram for malignant neoplasm of breast: Secondary | ICD-10-CM

## 2019-02-17 ENCOUNTER — Other Ambulatory Visit: Payer: Self-pay | Admitting: Family Medicine

## 2019-02-17 DIAGNOSIS — M8580 Other specified disorders of bone density and structure, unspecified site: Secondary | ICD-10-CM

## 2019-02-20 ENCOUNTER — Ambulatory Visit
Admission: RE | Admit: 2019-02-20 | Discharge: 2019-02-20 | Disposition: A | Discharge status: Home / Self Care | Payer: Medicare Other | Source: Ambulatory Visit | Attending: Family Medicine | Admitting: Family Medicine

## 2019-02-20 ENCOUNTER — Other Ambulatory Visit: Payer: Self-pay

## 2019-02-20 DIAGNOSIS — M8580 Other specified disorders of bone density and structure, unspecified site: Secondary | ICD-10-CM

## 2019-06-08 ENCOUNTER — Other Ambulatory Visit: Payer: Self-pay | Admitting: Family Medicine

## 2019-06-08 DIAGNOSIS — Z1231 Encounter for screening mammogram for malignant neoplasm of breast: Secondary | ICD-10-CM

## 2019-07-13 ENCOUNTER — Ambulatory Visit
Admission: RE | Admit: 2019-07-13 | Discharge: 2019-07-13 | Disposition: A | Discharge status: Home / Self Care | Payer: Medicare PPO | Source: Ambulatory Visit | Attending: Family Medicine | Admitting: Family Medicine

## 2019-07-13 ENCOUNTER — Other Ambulatory Visit: Payer: Self-pay

## 2019-07-13 DIAGNOSIS — Z1231 Encounter for screening mammogram for malignant neoplasm of breast: Secondary | ICD-10-CM

## 2019-11-05 ENCOUNTER — Ambulatory Visit: Payer: Self-pay | Attending: Internal Medicine

## 2019-11-05 DIAGNOSIS — Z23 Encounter for immunization: Secondary | ICD-10-CM

## 2019-11-05 NOTE — Progress Notes (Signed)
   Covid-19 Vaccination Clinic  Name:  Ashlee Rollins    MRN: [id number] DOB: [birth year]  11/05/2019  Ashlee Rollins was observed post Covid-19 immunization for 15 minutes without incident. She was provided with Vaccine Information Sheet and instruction to access the V-Safe system.   Ashlee Rollins was instructed to call 911 with any severe reactions post vaccine: Marland Kitchen Difficulty breathing  . Swelling of face and throat  . A fast heartbeat  . A bad rash all over body  . Dizziness and weakness   Immunizations Administered    Name Date Dose VIS Date Route   Moderna COVID-19 Vaccine 11/05/2019  1:42 PM 0.5 mL 04/2019 Intramuscular   Manufacturer: Moderna   Lot: [id number]   NDC: 03014-996-92

## 2020-06-14 ENCOUNTER — Other Ambulatory Visit: Payer: Self-pay | Admitting: Family Medicine

## 2020-06-14 DIAGNOSIS — Z1231 Encounter for screening mammogram for malignant neoplasm of breast: Secondary | ICD-10-CM

## 2020-07-27 ENCOUNTER — Ambulatory Visit: Payer: Medicare PPO

## 2020-09-13 ENCOUNTER — Other Ambulatory Visit: Payer: Self-pay

## 2020-09-13 ENCOUNTER — Ambulatory Visit
Admission: RE | Admit: 2020-09-13 | Discharge: 2020-09-13 | Disposition: A | Discharge status: Home / Self Care | Payer: Medicare PPO | Source: Ambulatory Visit | Attending: Family Medicine | Admitting: Family Medicine

## 2020-09-13 DIAGNOSIS — Z1231 Encounter for screening mammogram for malignant neoplasm of breast: Secondary | ICD-10-CM

## 2020-10-26 ENCOUNTER — Emergency Department (HOSPITAL_BASED_OUTPATIENT_CLINIC_OR_DEPARTMENT_OTHER)
Admission: EM | Admit: 2020-10-26 | Discharge: 2020-10-26 | Disposition: A | Discharge status: Home / Self Care | Payer: Medicare PPO | Attending: Emergency Medicine | Admitting: Emergency Medicine

## 2020-10-26 ENCOUNTER — Encounter (HOSPITAL_BASED_OUTPATIENT_CLINIC_OR_DEPARTMENT_OTHER): Payer: Self-pay | Admitting: *Deleted

## 2020-10-26 ENCOUNTER — Other Ambulatory Visit: Payer: Self-pay

## 2020-10-26 DIAGNOSIS — J1282 Pneumonia due to coronavirus disease 2019: Secondary | ICD-10-CM | POA: Insufficient documentation

## 2020-10-26 DIAGNOSIS — E039 Hypothyroidism, unspecified: Secondary | ICD-10-CM | POA: Insufficient documentation

## 2020-10-26 DIAGNOSIS — U071 COVID-19: Secondary | ICD-10-CM | POA: Diagnosis not present

## 2020-10-26 DIAGNOSIS — Z79899 Other long term (current) drug therapy: Secondary | ICD-10-CM | POA: Insufficient documentation

## 2020-10-26 DIAGNOSIS — R509 Fever, unspecified: Secondary | ICD-10-CM | POA: Diagnosis present

## 2020-10-26 MED ORDER — ONDANSETRON 4 MG PO TBDP: ORAL_TABLET | ORAL | 0 refills | Status: AC

## 2020-10-26 NOTE — ED Provider Notes (Signed)
[facility] EMERGENCY DEPT Provider Note   CSN: [id number] Arrival date & time: 10/26/20  1045     History Chief Complaint  Patient presents with   Fever   Cough    Ashlee Rollins is a 75 y.o. female.  Patient with no significant medical history, no history of lung or heart disease presents with fever cough body aches since yesterday.  Patient tested positive morning and had a positive home test.  Vomited a few times and decreased appetite today.  No shortness of breath or chest pain.      Past Medical History:  Diagnosis Date   Allergy    Colon polyps    Constipation    Fibrocystic breast disease    Hypothyroidism    Osteoporosis    Overweight (BMI 25.0-29.9)     Patient Active Problem List   Diagnosis Date Noted   Seasonal allergies 12/21/2013   Arthritis 12/21/2013   Insomnia 12/21/2013   Estrogen deficiency 12/21/2013   Other malaise and fatigue 11/13/2013   Depressive disorder, not elsewhere classified 11/13/2013   Other and unspecified hyperlipidemia 08/14/2013   Unspecified hypothyroidism 08/14/2013   Encounter for therapeutic drug monitoring 08/14/2013   Unspecified vitamin D deficiency 08/14/2013   Allergic rhinitis 01/17/2013   Overweight 01/17/2013    Past Surgical History:  Procedure Laterality Date   BREAST BIOPSY     BREAST EXCISIONAL BIOPSY Left 18 years ago   benign   THYROIDECTOMY       OB History   No obstetric history on file.     Family History  Problem Relation Age of Onset   Diabetes Brother    COPD Mother        Smoker   Cancer Paternal Aunt        Breast Cancer   Cancer Paternal Grandmother        Breast Cancer    Social History   Tobacco Use   Smoking status: Never   Smokeless tobacco: Never  Substance Use Topics   Alcohol use: Yes    Comment: Occasional    Drug use: No    Home Medications Prior to Admission medications   Medication Sig Start Date End Date Taking? Authorizing  Provider  ondansetron (ZOFRAN ODT) 4 MG disintegrating tablet 4mg  ODT q4 hours prn nausea/vomit 10/26/20  Yes 10/28/20, MD  mometasone (NASONEX) 50 MCG/ACT nasal spray Place 2 sprays into the nose daily. 12/17/13 01/01/15  01/03/15, MD  phentermine (ADIPEX-P) 37.5 MG tablet Take 1 tablet (37.5 mg total) by mouth daily before breakfast. 07/31/13 07/31/14  Zanard, 08/02/14, MD  progesterone (PROMETRIUM) 100 MG capsule Take 1 capsule (100 mg total) by mouth at bedtime. 12/17/13 12/18/14  02/17/15, MD  Thyroid (NATURE-THROID) 48.75 MG TABS Take 1 tablet by mouth every evening.    [provider]  thyroid (NATURE-THROID) 65 MG tablet Take 65 mg by mouth every morning.    [provider]    Allergies    Patient has no known allergies.  Review of Systems   Review of Systems  Constitutional:  Positive for chills and fever.  HENT:  Positive for congestion.   Eyes:  Negative for visual disturbance.  Respiratory:  Positive for cough. Negative for shortness of breath.   Cardiovascular:  Negative for chest pain.  Gastrointestinal:  Positive for nausea and vomiting. Negative for abdominal pain.  Genitourinary:  Negative for dysuria and flank pain.  Musculoskeletal:  Negative for back pain, neck  pain and neck stiffness.  Skin:  Negative for rash.  Neurological:  Negative for light-headedness and headaches.   Physical Exam Updated Vital Signs BP (!) 141/78 (BP Location: Left Arm)   Pulse 87   Temp 98.5 F (36.9 C) (Oral)   Resp 18   Ht 5' 5.5" (1.664 m)   Wt 70.3 kg   SpO2 99%   BMI 25.40 kg/m   Physical Exam Vitals and nursing note reviewed.  Constitutional:      General: She is not in acute distress.    Appearance: She is well-developed.  HENT:     Head: Normocephalic.     Mouth/Throat:     Mouth: Mucous membranes are dry.  Eyes:     General:        Right eye: No discharge.        Left eye: No discharge.     Conjunctiva/sclera: Conjunctivae normal.   Neck:     Trachea: No tracheal deviation.  Cardiovascular:     Rate and Rhythm: Normal rate and regular rhythm.     Heart sounds: No murmur heard. Pulmonary:     Effort: Pulmonary effort is normal.     Breath sounds: Normal breath sounds.  Abdominal:     General: There is no distension.  Musculoskeletal:     Cervical back: Normal range of motion. No rigidity.  Skin:    General: Skin is warm.     Capillary Refill: Capillary refill takes less than 2 seconds.  Neurological:     General: No focal deficit present.     Mental Status: She is alert.  Psychiatric:        Mood and Affect: Mood normal.    ED Results / Procedures / Treatments   Labs (all labs ordered are listed, but only abnormal results are displayed) Labs Reviewed - No data to display  EKG None  Radiology No results found.  Procedures Procedures   Medications Ordered in ED Medications - No data to display  ED Course  I have reviewed the triage vital signs and the nursing notes.  Pertinent labs & imaging results that were available during my care of the patient were reviewed by me and considered in my medical decision making (see chart for details).    MDM Rules/Calculators/A&P                          Patient presents with recent diagnosis of COVID.  Patient did have vomiting episode however no signs of significant dehydration.  Normal heart rate, overall well-appearing.  Discussed supportive care Zofran Tylenol Motrin as needed.  Discussed reasons to return.  Ashlee Rollins was evaluated in Emergency Department on 10/26/2020 for the symptoms described in the history of present illness. She was evaluated in the context of the global COVID-19 pandemic, which necessitated consideration that the patient might be at risk for infection with the SARS-CoV-2 virus that causes COVID-19. Institutional protocols and algorithms that pertain to the evaluation of patients at risk for COVID-19 are in a state of  rapid change based on information released by regulatory bodies including the CDC and federal and state organizations. These policies and algorithms were followed during the patient's care in the ED.   Final Clinical Impression(s) / ED Diagnoses Final diagnoses:  Pneumonia due to COVID-19 virus    Rx / DC Orders ED Discharge Orders          Ordered    ondansetron (  ZOFRAN ODT) 4 MG disintegrating tablet        10/26/20 1242             Blane Ohara, MD 10/26/20 1247

## 2020-10-26 NOTE — Discharge Instructions (Addendum)
Use Zofran as needed for nausea and vomiting. Use Tylenol every 4 hours and ibuprofen every 6 hours as needed for body aches pain and fever.  Stay well-hydrated as tolerated. Return for worsening shortness of breath or new concerns.

## 2020-10-26 NOTE — ED Triage Notes (Signed)
Developed fever and cough yesterday, Tested Covid + this morning, No fever in triage, cough nonproductive. Has not ate today after throwing up x 1 last night.

## 2021-08-02 ENCOUNTER — Other Ambulatory Visit: Payer: Self-pay | Admitting: Family Medicine

## 2021-08-02 DIAGNOSIS — Z1231 Encounter for screening mammogram for malignant neoplasm of breast: Secondary | ICD-10-CM

## 2021-09-19 ENCOUNTER — Ambulatory Visit
Admission: RE | Admit: 2021-09-19 | Discharge: 2021-09-19 | Disposition: A | Discharge status: Home / Self Care | Payer: Medicare PPO | Source: Ambulatory Visit | Attending: Family Medicine | Admitting: Family Medicine

## 2021-09-19 ENCOUNTER — Other Ambulatory Visit: Payer: Self-pay | Admitting: Family Medicine

## 2021-09-19 DIAGNOSIS — Z1231 Encounter for screening mammogram for malignant neoplasm of breast: Secondary | ICD-10-CM

## 2022-08-08 ENCOUNTER — Other Ambulatory Visit: Payer: Self-pay | Admitting: Family Medicine

## 2022-08-08 ENCOUNTER — Other Ambulatory Visit: Payer: Self-pay

## 2022-08-08 DIAGNOSIS — Z1231 Encounter for screening mammogram for malignant neoplasm of breast: Secondary | ICD-10-CM

## 2022-09-21 ENCOUNTER — Ambulatory Visit
Admission: RE | Admit: 2022-09-21 | Discharge: 2022-09-21 | Disposition: A | Discharge status: Home / Self Care | Payer: Medicare PPO | Source: Ambulatory Visit | Attending: Family Medicine | Admitting: Family Medicine

## 2022-09-21 DIAGNOSIS — Z1231 Encounter for screening mammogram for malignant neoplasm of breast: Secondary | ICD-10-CM

## 2022-09-25 ENCOUNTER — Other Ambulatory Visit: Payer: Self-pay | Admitting: Family Medicine

## 2022-09-25 DIAGNOSIS — R928 Other abnormal and inconclusive findings on diagnostic imaging of breast: Secondary | ICD-10-CM

## 2022-10-24 ENCOUNTER — Ambulatory Visit
Admission: RE | Admit: 2022-10-24 | Discharge: 2022-10-24 | Disposition: A | Discharge status: Home / Self Care | Payer: Medicare PPO | Source: Ambulatory Visit | Attending: Family Medicine | Admitting: Family Medicine

## 2022-10-24 DIAGNOSIS — R928 Other abnormal and inconclusive findings on diagnostic imaging of breast: Secondary | ICD-10-CM

## 2022-10-25 ENCOUNTER — Other Ambulatory Visit: Payer: Self-pay | Admitting: Family Medicine

## 2022-10-25 DIAGNOSIS — R921 Mammographic calcification found on diagnostic imaging of breast: Secondary | ICD-10-CM

## 2022-11-08 ENCOUNTER — Ambulatory Visit
Admission: RE | Admit: 2022-11-08 | Discharge: 2022-11-08 | Disposition: A | Discharge status: Home / Self Care | Payer: Medicare PPO | Source: Ambulatory Visit | Attending: Family Medicine | Admitting: Family Medicine

## 2022-11-08 DIAGNOSIS — R921 Mammographic calcification found on diagnostic imaging of breast: Secondary | ICD-10-CM

## 2022-11-08 HISTORY — PX: BREAST BIOPSY: SHX20

## 2023-03-26 ENCOUNTER — Other Ambulatory Visit: Payer: Self-pay | Admitting: Family Medicine

## 2023-03-26 DIAGNOSIS — R928 Other abnormal and inconclusive findings on diagnostic imaging of breast: Secondary | ICD-10-CM

## 2023-04-18 ENCOUNTER — Other Ambulatory Visit: Payer: Self-pay | Admitting: Family Medicine

## 2023-04-18 DIAGNOSIS — R928 Other abnormal and inconclusive findings on diagnostic imaging of breast: Secondary | ICD-10-CM

## 2023-04-23 ENCOUNTER — Other Ambulatory Visit: Payer: Self-pay | Admitting: Family Medicine

## 2023-04-23 DIAGNOSIS — E2839 Other primary ovarian failure: Secondary | ICD-10-CM

## 2023-05-01 ENCOUNTER — Ambulatory Visit
Admission: RE | Admit: 2023-05-01 | Discharge: 2023-05-01 | Disposition: A | Discharge status: Home / Self Care | Payer: Medicare PPO | Source: Ambulatory Visit | Attending: Family Medicine | Admitting: Family Medicine

## 2023-05-01 ENCOUNTER — Ambulatory Visit: Payer: Medicare PPO

## 2023-05-01 DIAGNOSIS — R928 Other abnormal and inconclusive findings on diagnostic imaging of breast: Secondary | ICD-10-CM

## 2023-07-23 IMAGING — MG MM DIGITAL SCREENING BILAT W/ TOMO AND CAD
8 series · 8 of 24 positions shown · non-contrast
Comparison: Previous exam(s).

CLINICAL DATA: Screening.

EXAM:
DIGITAL SCREENING BILATERAL MAMMOGRAM WITH TOMOSYNTHESIS AND CAD
TECHNIQUE: Bilateral screening digital craniocaudal and mediolateral oblique
mammograms were obtained. Bilateral screening digital breast
tomosynthesis was performed. The images were evaluated with
computer-aided detection.

[R MLO synth-2D]
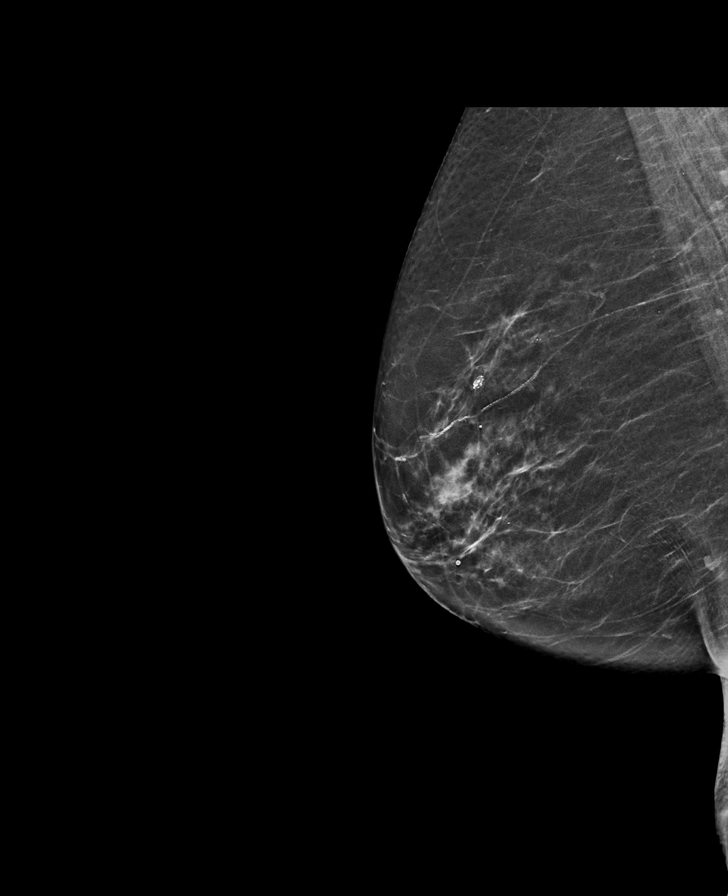

[R CC synth-2D]
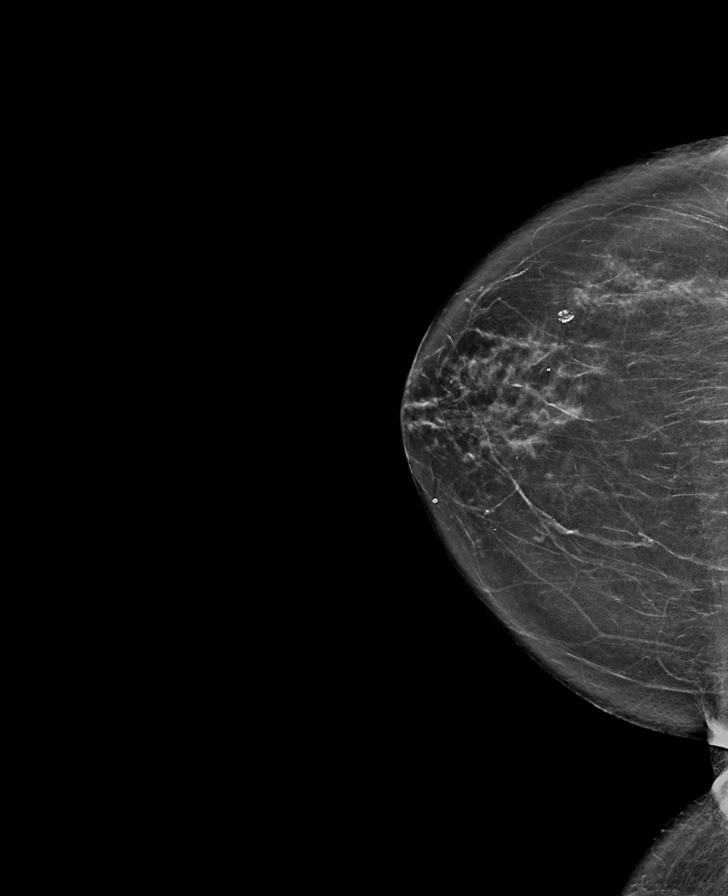

[L CC synth-2D]
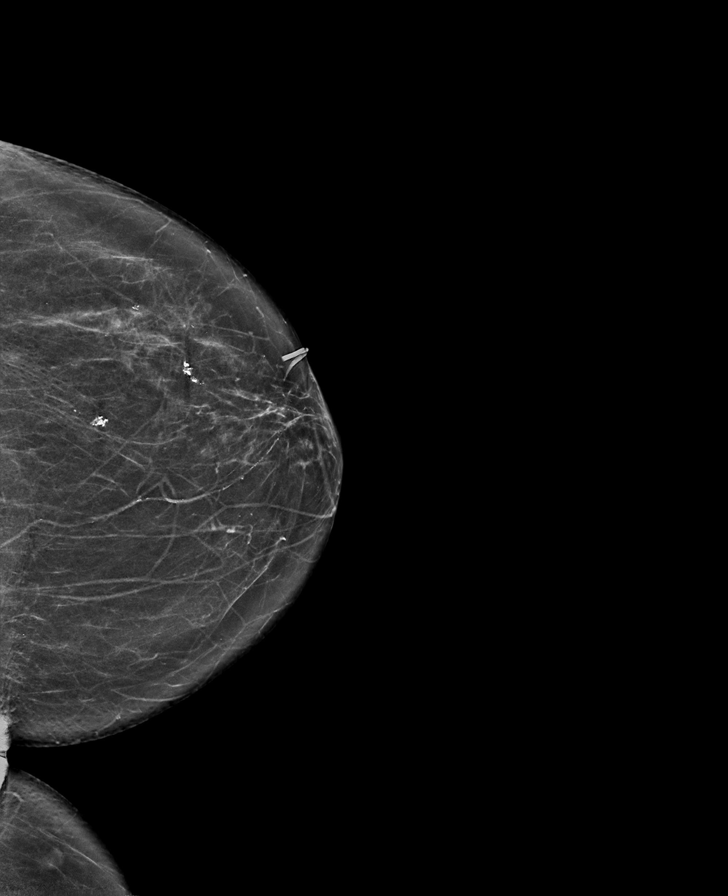

[L MLO synth-2D]
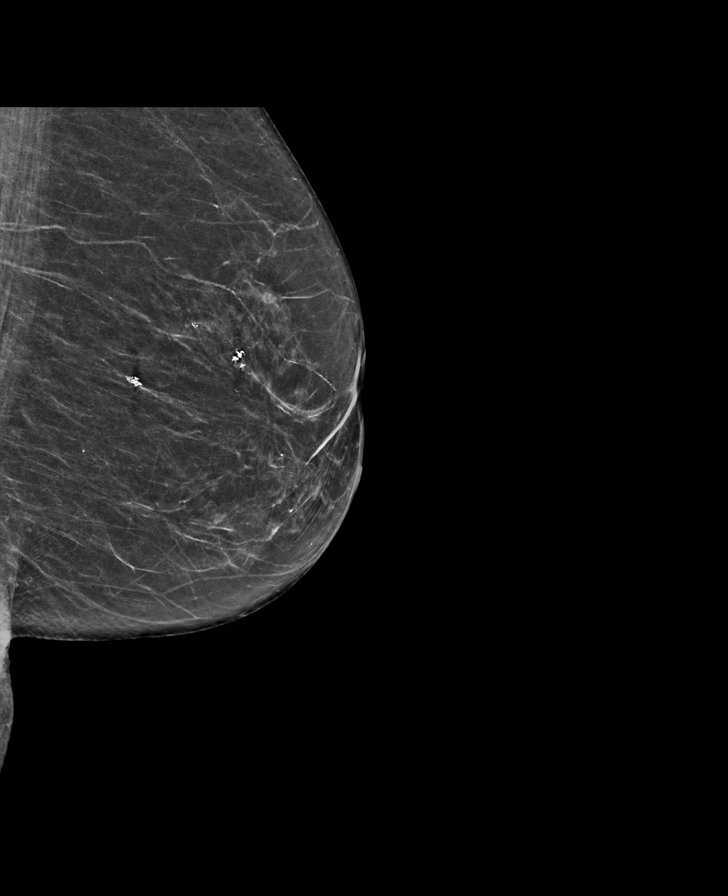

[L MLO tomo · tomo slice 33/64.0]
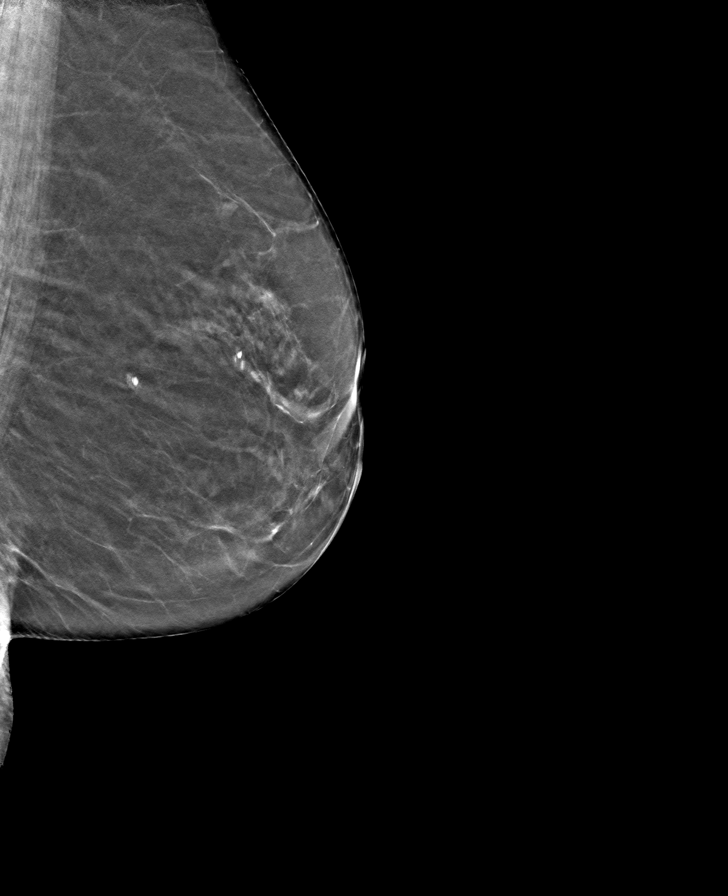

[R MLO tomo · tomo slice 33/65.0]
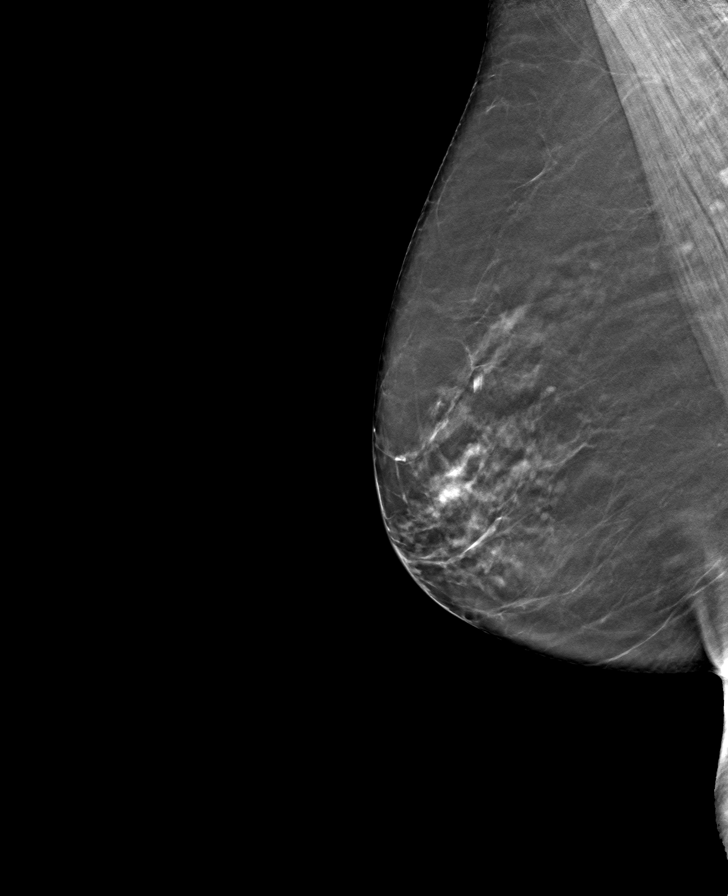

[L CC tomo · tomo slice 34/67.0]
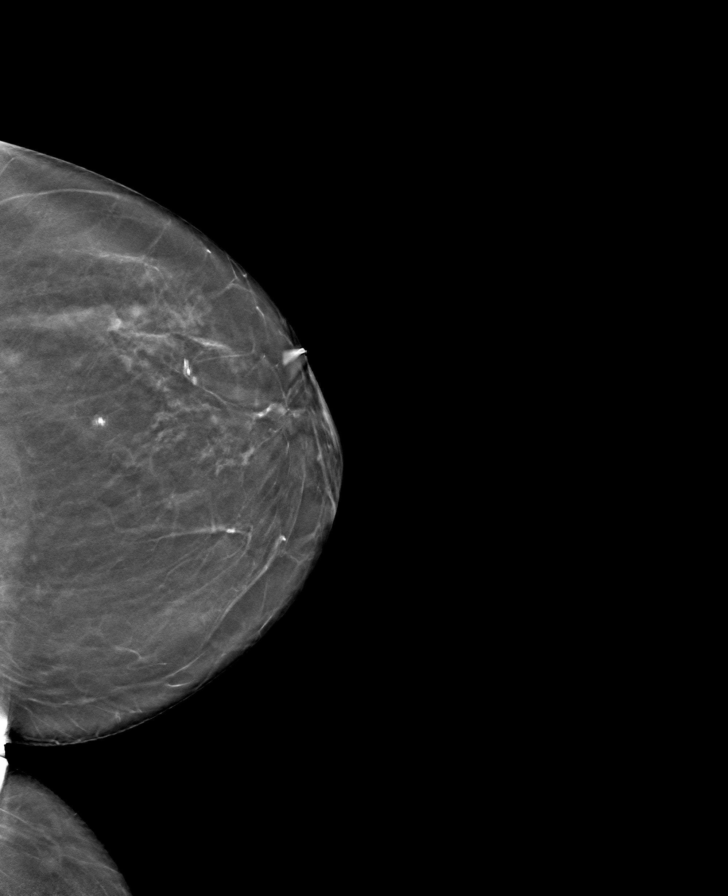

[R CC tomo · tomo slice 35/70.0]
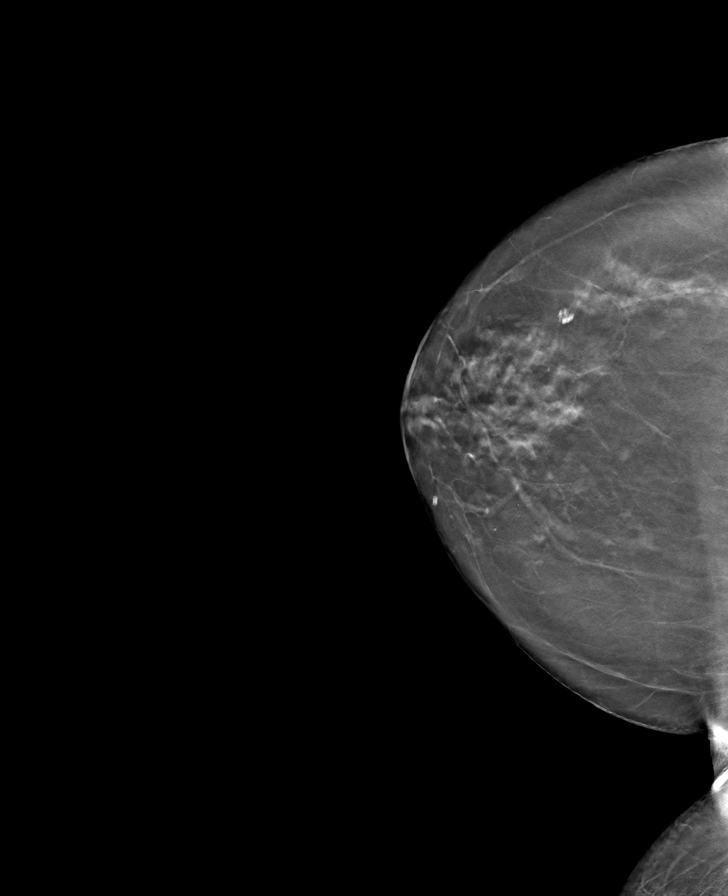

[8 of 24 positions shown; findings below may reference images not displayed]

ACR Breast Density Category b: There are scattered areas of
fibroglandular density.
FINDINGS: There are no findings suspicious for malignancy.
IMPRESSION: No mammographic evidence of malignancy. A result letter of this
screening mammogram will be mailed directly to the patient.

RECOMMENDATION:
Screening mammogram in one year. (Code:51-O-LD2)

BI-RADS CATEGORY  1: Negative.

## 2023-09-18 ENCOUNTER — Other Ambulatory Visit: Payer: Self-pay | Admitting: Family Medicine

## 2023-09-18 DIAGNOSIS — R928 Other abnormal and inconclusive findings on diagnostic imaging of breast: Secondary | ICD-10-CM

## 2023-10-29 ENCOUNTER — Other Ambulatory Visit: Payer: Self-pay | Admitting: Family Medicine

## 2023-10-29 DIAGNOSIS — R921 Mammographic calcification found on diagnostic imaging of breast: Secondary | ICD-10-CM

## 2023-10-30 ENCOUNTER — Other Ambulatory Visit: Payer: Self-pay | Admitting: Family Medicine

## 2023-10-30 DIAGNOSIS — R921 Mammographic calcification found on diagnostic imaging of breast: Secondary | ICD-10-CM

## 2023-10-31 ENCOUNTER — Ambulatory Visit
Admission: RE | Admit: 2023-10-31 | Discharge: 2023-10-31 | Disposition: A | Discharge status: Home / Self Care | Source: Ambulatory Visit | Attending: Family Medicine | Admitting: Family Medicine

## 2023-10-31 DIAGNOSIS — R921 Mammographic calcification found on diagnostic imaging of breast: Secondary | ICD-10-CM

## 2024-01-01 ENCOUNTER — Other Ambulatory Visit: Payer: Medicare PPO

## 2024-02-10 ENCOUNTER — Other Ambulatory Visit (HOSPITAL_BASED_OUTPATIENT_CLINIC_OR_DEPARTMENT_OTHER)

## 2024-02-10 ENCOUNTER — Encounter (HOSPITAL_BASED_OUTPATIENT_CLINIC_OR_DEPARTMENT_OTHER): Payer: Self-pay
